# Patient Record
Sex: Female | Born: 1995 | Race: White | Hispanic: No | Marital: Single | Smoking: Never smoker
Health system: Southern US, Community
[De-identification: ages and names within clinical notes are randomized; demographics above are authoritative.]

## PROBLEM LIST (undated history)

## (undated) DIAGNOSIS — G35 Multiple sclerosis: Secondary | ICD-10-CM

---

## 2021-02-01 ENCOUNTER — Emergency Department (INDEPENDENT_AMBULATORY_CARE_PROVIDER_SITE_OTHER)
Admission: EM | Admit: 2021-02-01 | Discharge: 2021-02-01 | Disposition: A | Discharge status: Home / Self Care | Payer: BC Managed Care – PPO | Source: Home / Self Care | Attending: Family Medicine | Admitting: Family Medicine

## 2021-02-01 ENCOUNTER — Emergency Department (INDEPENDENT_AMBULATORY_CARE_PROVIDER_SITE_OTHER): Payer: BC Managed Care – PPO

## 2021-02-01 ENCOUNTER — Emergency Department: Admit: 2021-02-01 | Discharge status: Absent without leave | Payer: Self-pay

## 2021-02-01 DIAGNOSIS — M79672 Pain in left foot: Secondary | ICD-10-CM

## 2021-02-01 DIAGNOSIS — S9032XA Contusion of left foot, initial encounter: Secondary | ICD-10-CM

## 2021-02-01 DIAGNOSIS — W208XXA Other cause of strike by thrown, projected or falling object, initial encounter: Secondary | ICD-10-CM | POA: Diagnosis not present

## 2021-02-01 DIAGNOSIS — M25475 Effusion, left foot: Secondary | ICD-10-CM

## 2021-02-01 HISTORY — DX: Multiple sclerosis: G35

## 2021-02-01 NOTE — ED Triage Notes (Signed)
Pt c/o LT foot pain since last night. Dropped hairdryer on top of foot. Bruising and swelling noted. Motrin prn. Pain 7/10 Wearing Cam Walker in triage. Dx with MS

## 2021-02-01 NOTE — Discharge Instructions (Signed)
Use ice and elevation to reduce the pain and swelling Advil or aleve for pain Return as needed

## 2021-02-01 NOTE — ED Provider Notes (Signed)
[facility]    CSN: [id number] Arrival date & time: 02/01/21  1728      History   Chief Complaint Chief Complaint  Patient presents with   Foot Pain    LT    HPI Stephanie Conrad is a 25 y.o. female.   HPI  Pleasant 25 year old woman.  History of multiple sclerosis.  Also has Raynaud's phenomenon.  Is here because she dropped a hair dryer on her left foot yesterday.  There is bruising and swelling and pain with weightbearing.  She has concern for fracture.  Past Medical History:  Diagnosis Date   Multiple sclerosis (HCC)     There are no problems to display for this patient.   History reviewed. No pertinent surgical history.  OB History   No obstetric history on file.      Home Medications    Prior to Admission medications   Medication Sig Start Date End Date Taking? Authorizing Provider  diazepam (VALIUM) 5 MG tablet Take one tablet 30 minutes prior to MRI. May repeat x1 if needed. Must have driver 10/25/75  Yes [provider]  escitalopram (LEXAPRO) 5 MG tablet Take 1 tablet by mouth daily. 11/17/19  Yes [provider]  propranolol (INDERAL) 40 MG tablet Take 1 tablet by mouth daily. 11/23/20 02/21/21 Yes [provider]  Drospirenone (SLYND) 4 MG TABS Take by mouth.    [provider]  modafinil (PROVIGIL) 100 MG tablet Take by mouth. 01/02/21 02/01/21  [provider]    Family History Family History  Problem Relation Age of Onset   Hypertension Mother    Hyperlipidemia Mother    Hypertension Father    Hyperlipidemia Father    Diabetes Other    Cancer Other     Social History Social History   Tobacco Use   Smoking status: Never   Smokeless tobacco: Never  Vaping Use   Vaping Use: Never used  Substance Use Topics   Alcohol use: Not Currently     Allergies   Patient has no known allergies.   Review of Systems Review of Systems  See HPI Physical Exam Triage Vital Signs ED Triage Vitals   Enc Vitals Group     BP 02/01/21 1750 119/81     Pulse Rate 02/01/21 1750 79     Resp 02/01/21 1750 17     Temp 02/01/21 1750 98 F (36.7 C)     Temp Source 02/01/21 1750 Oral     SpO2 02/01/21 1750 96 %     Weight --      Height --      Head Circumference --      Peak Flow --      Pain Score 02/01/21 1751 7     Pain Loc --      Pain Edu? --      Excl. in GC? --    No data found.  Updated Vital Signs BP 119/81 (BP Location: Right Arm)   Pulse 79   Temp 98 F (36.7 C) (Oral)   Resp 17   LMP 01/16/2021 (Approximate)   SpO2 96%       Physical Exam Constitutional:      General: She is not in acute distress.    Appearance: She is well-developed.  HENT:     Head: Normocephalic and atraumatic.  Eyes:     Conjunctiva/sclera: Conjunctivae normal.     Pupils: Pupils are equal, round, and reactive to light.  Cardiovascular:  Rate and Rhythm: Normal rate.  Pulmonary:     Effort: Pulmonary effort is normal. No respiratory distress.  Abdominal:     General: There is no distension.     Palpations: Abdomen is soft.  Musculoskeletal:        General: Normal range of motion.     Cervical back: Normal range of motion.       Feet:  Feet:     Comments: Raynaud's phenomenal of toes noted with a dusky appearance Skin:    General: Skin is warm and dry.  Neurological:     Mental Status: She is alert.     Gait: Gait abnormal.     UC Treatments / Results  Labs (all labs ordered are listed, but only abnormal results are displayed) Labs Reviewed - No data to display  EKG   Radiology DG Foot Complete Left  Result Date: 02/01/2021 CLINICAL DATA:  Dropped heavy object on foot. EXAM: LEFT FOOT - COMPLETE 3+ VIEW COMPARISON:  None. FINDINGS: There is no evidence of fracture or dislocation. There is no evidence of arthropathy or other focal bone abnormality. Soft tissues are unremarkable. IMPRESSION: Negative. Electronically Signed   By: Charlett Nose M.D.   On: 02/01/2021  18:13    Procedures Procedures (including critical care time)  Medications Ordered in UC Medications - No data to display  Initial Impression / Assessment and Plan / UC Course  I have reviewed the triage vital signs and the nursing notes.  Pertinent labs & imaging results that were available during my care of the patient were reviewed by me and considered in my medical decision making (see chart for details).     No fracture identified.  Conservative care reviewed. Final Clinical Impressions(s) / UC Diagnoses   Final diagnoses:  Contusion of left foot, initial encounter     Discharge Instructions      Use ice and elevation to reduce the pain and swelling Advil or aleve for pain Return as needed     ED Prescriptions   None    PDMP not reviewed this encounter.   Eustace Moore, MD 02/01/21 [phone]

## 2022-11-03 IMAGING — DX DG FOOT COMPLETE 3+V*L*
3 series · 3 of 3 positions shown · non-contrast
Comparison: None.

CLINICAL DATA: Dropped heavy object on foot.

EXAM:
LEFT FOOT - COMPLETE 3+ VIEW

[foot ap]
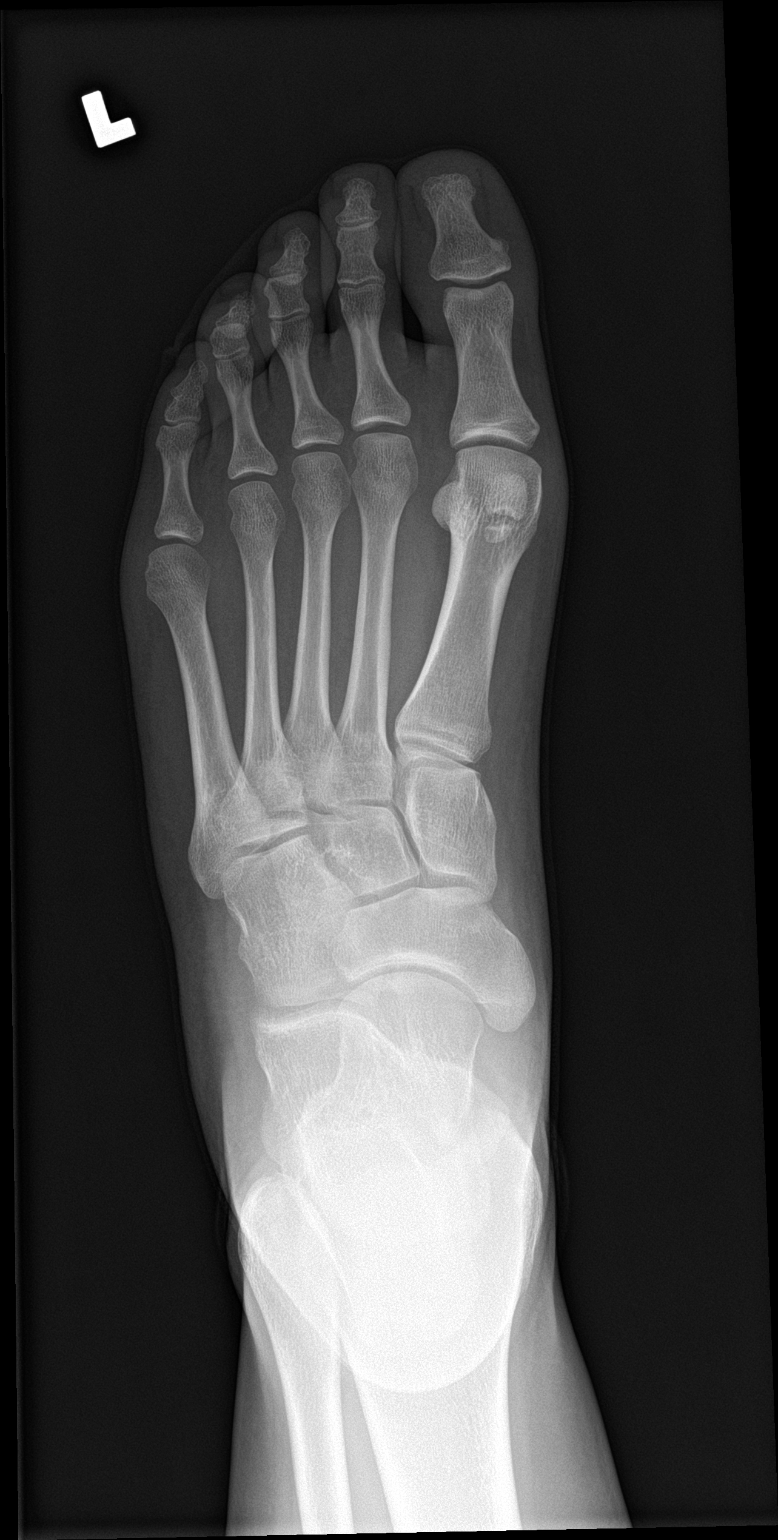

[foot obl]
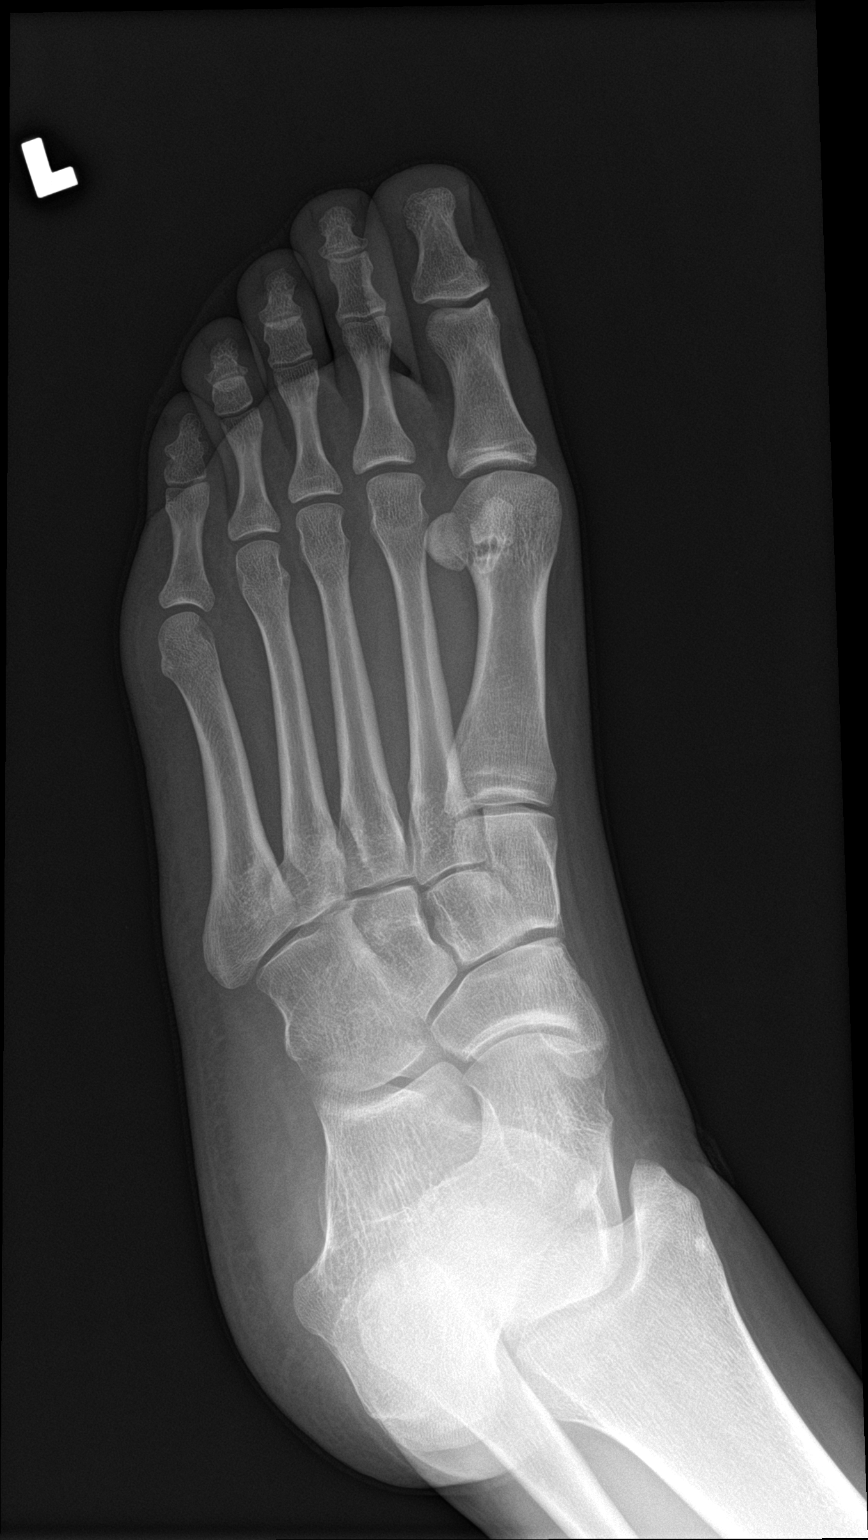

[foot lat]
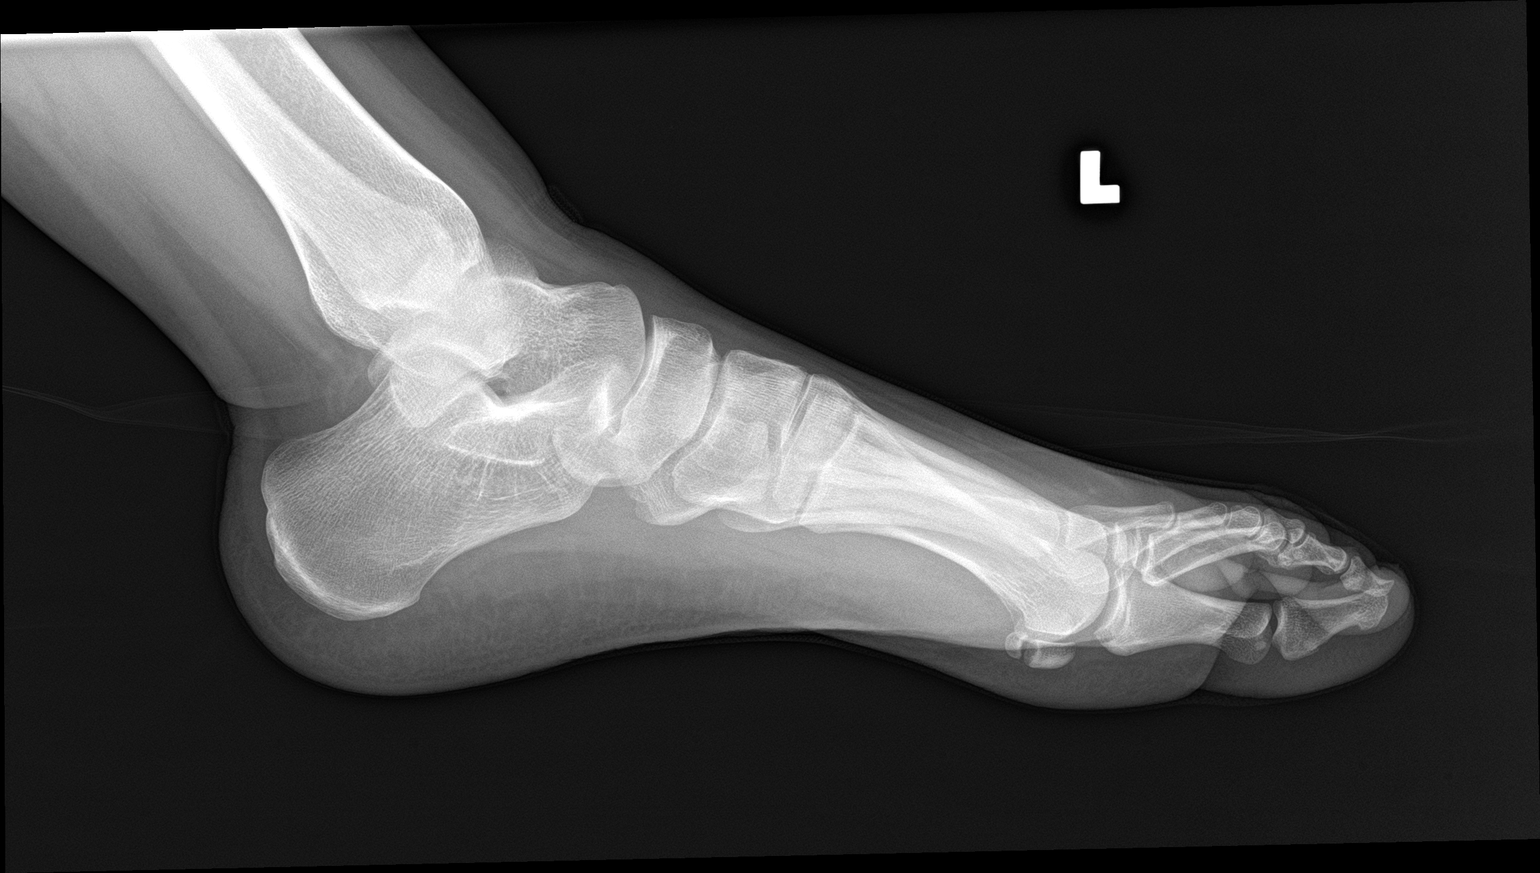

[3 of 3 positions shown; findings below may reference images not displayed]

FINDINGS: There is no evidence of fracture or dislocation. There is no
evidence of arthropathy or other focal bone abnormality. Soft
tissues are unremarkable.
IMPRESSION: Negative.
